# Patient Record
Sex: Female | Born: 1950 | Race: White | Hispanic: No | Marital: Married | Smoking: Never smoker
Health system: Southern US, Community
[De-identification: ages and names within clinical notes are randomized; demographics above are authoritative.]

## PROBLEM LIST (undated history)

## (undated) HISTORY — PX: OTHER SURGICAL HISTORY: SHX169

---

## 2008-10-28 ENCOUNTER — Emergency Department (HOSPITAL_COMMUNITY): Admission: EM | Admit: 2008-10-28 | Discharge: 2008-10-28 | Payer: Self-pay | Admitting: Emergency Medicine

## 2008-11-03 ENCOUNTER — Inpatient Hospital Stay (HOSPITAL_COMMUNITY): Admission: RE | Admit: 2008-11-03 | Discharge: 2008-11-05 | Payer: Self-pay | Admitting: Orthopaedic Surgery

## 2008-11-16 ENCOUNTER — Encounter (HOSPITAL_COMMUNITY): Admission: RE | Admit: 2008-11-16 | Discharge: 2008-12-16 | Payer: Self-pay | Admitting: Orthopaedic Surgery

## 2008-11-29 ENCOUNTER — Inpatient Hospital Stay (HOSPITAL_COMMUNITY): Admission: RE | Admit: 2008-11-29 | Discharge: 2008-12-02 | Payer: Self-pay | Admitting: Orthopaedic Surgery

## 2008-11-30 ENCOUNTER — Ambulatory Visit: Payer: Self-pay | Admitting: Infectious Diseases

## 2008-12-19 ENCOUNTER — Encounter (HOSPITAL_COMMUNITY): Admission: RE | Admit: 2008-12-19 | Discharge: 2009-01-18 | Payer: Self-pay | Admitting: Orthopaedic Surgery

## 2009-01-30 ENCOUNTER — Encounter (HOSPITAL_COMMUNITY): Admission: RE | Admit: 2009-01-30 | Discharge: 2009-02-14 | Payer: Self-pay | Admitting: Orthopaedic Surgery

## 2009-02-06 ENCOUNTER — Inpatient Hospital Stay (HOSPITAL_COMMUNITY): Admission: AD | Admit: 2009-02-06 | Discharge: 2009-02-09 | Payer: Self-pay | Admitting: Orthopaedic Surgery

## 2010-08-03 IMAGING — CR DG CHEST 1V PORT
1 series · 1 of 1 positions shown · non-contrast
Comparison: None

CLINICAL DATA: PICC line placement

PORTABLE CHEST - 1 VIEW

[AP]
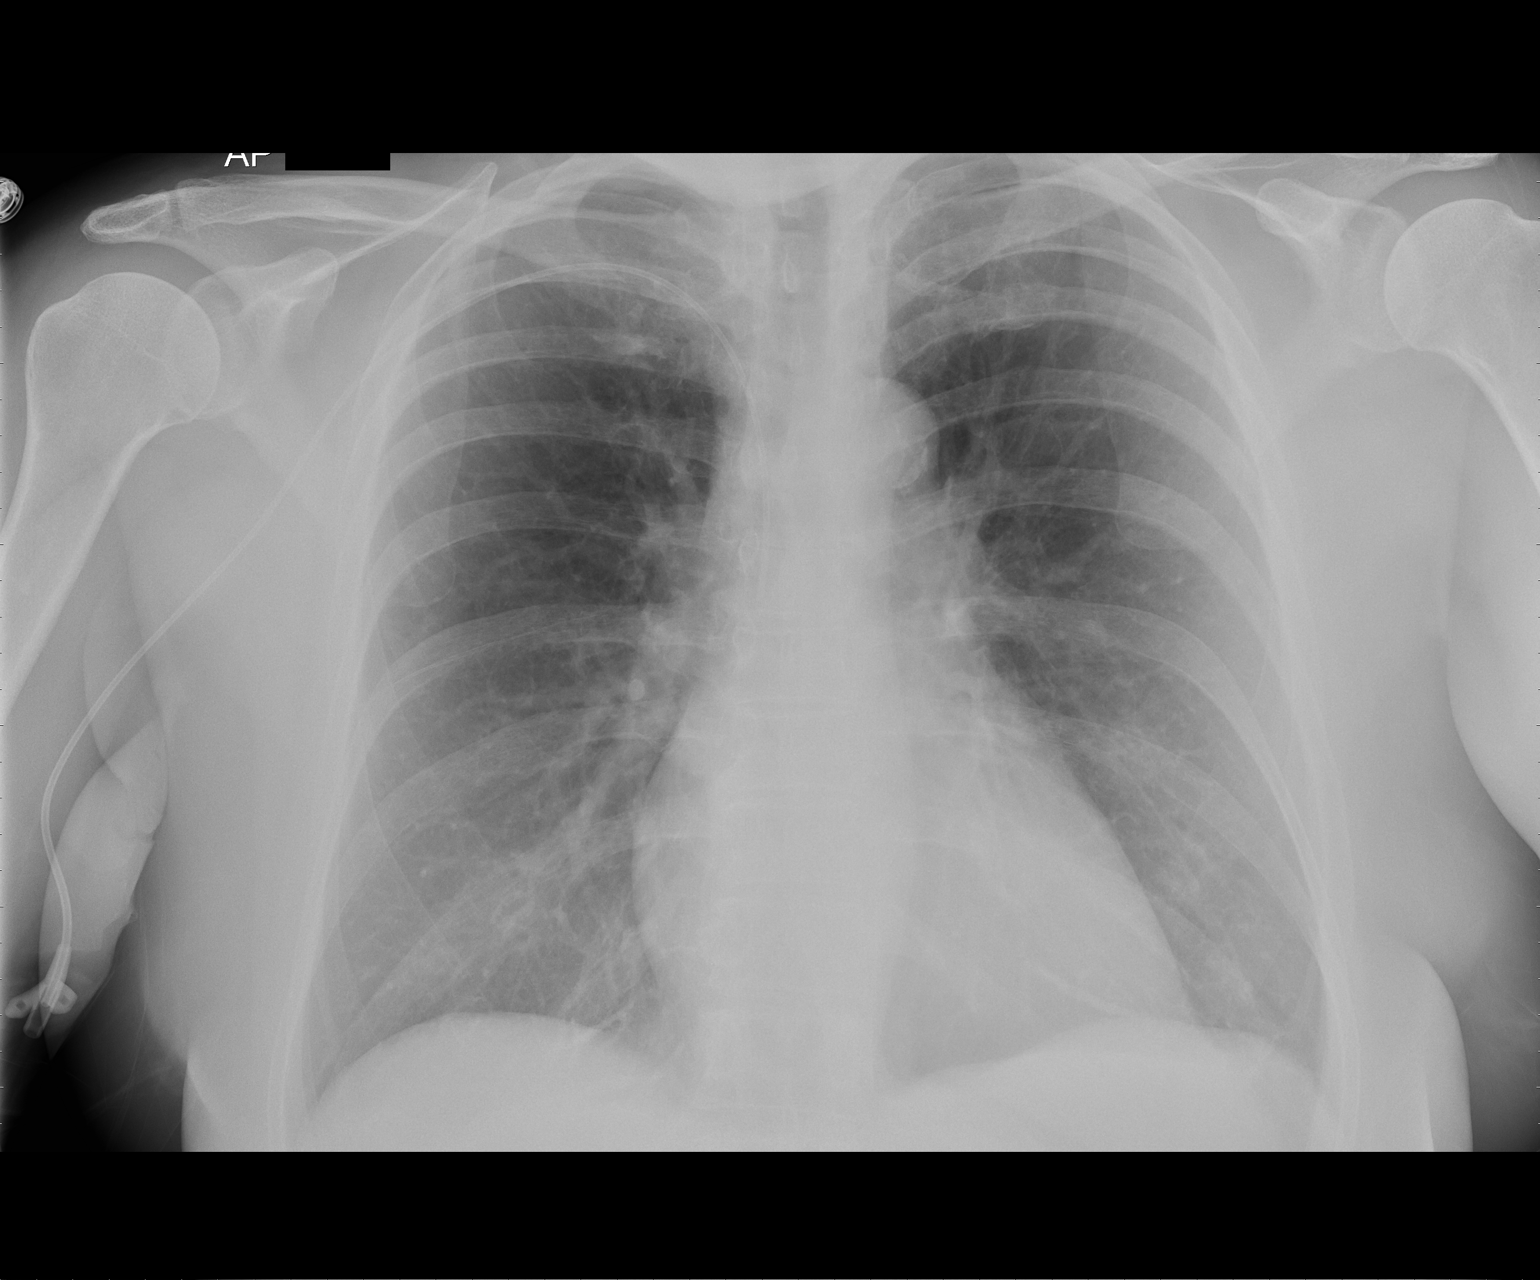

[1 of 1 positions shown; findings below may reference images not displayed]

FINDINGS: PICC line has been inserted via right upper extremity
approach.  Catheter tip is in the mid SVC.  No active
cardiopulmonary disease.
IMPRESSION: Specifically, the PICC line is in the mid SVC.

## 2010-08-23 LAB — COMPREHENSIVE METABOLIC PANEL
ALT: 10 U/L (ref 0–35)
BUN: 25 mg/dL — ABNORMAL HIGH (ref 6–23)
Calcium: 8.6 mg/dL (ref 8.4–10.5)
Creatinine, Ser: 0.82 mg/dL (ref 0.4–1.2)
Glucose, Bld: 106 mg/dL — ABNORMAL HIGH (ref 70–99)
Sodium: 138 mEq/L (ref 135–145)
Total Protein: 6.4 g/dL (ref 6.0–8.3)

## 2010-08-23 LAB — URINALYSIS, MICROSCOPIC ONLY
Nitrite: NEGATIVE
Specific Gravity, Urine: 1.021 (ref 1.005–1.030)
Urobilinogen, UA: 0.2 mg/dL (ref 0.0–1.0)
pH: 6 (ref 5.0–8.0)

## 2010-08-23 LAB — C-REACTIVE PROTEIN: CRP: 0.9 mg/dL — ABNORMAL HIGH (ref <0.6)

## 2010-08-23 LAB — CBC
Hemoglobin: 11.1 g/dL — ABNORMAL LOW (ref 12.0–15.0)
MCHC: 34.5 g/dL (ref 30.0–36.0)
RDW: 12.7 % (ref 11.5–15.5)

## 2010-08-23 LAB — CULTURE, ROUTINE-ABSCESS

## 2010-08-23 LAB — DIFFERENTIAL
Lymphocytes Relative: 39 % (ref 12–46)
Lymphs Abs: 2.5 10*3/uL (ref 0.7–4.0)
Neutro Abs: 3.2 10*3/uL (ref 1.7–7.7)
Neutrophils Relative %: 50 % (ref 43–77)

## 2010-08-23 LAB — SEDIMENTATION RATE: Sed Rate: 33 mm/hr — ABNORMAL HIGH (ref 0–22)

## 2010-08-25 LAB — COMPREHENSIVE METABOLIC PANEL
Albumin: 3.1 g/dL — ABNORMAL LOW (ref 3.5–5.2)
Alkaline Phosphatase: 97 U/L (ref 39–117)
BUN: 6 mg/dL (ref 6–23)
Calcium: 8.9 mg/dL (ref 8.4–10.5)
Potassium: 4.3 mEq/L (ref 3.5–5.1)
Sodium: 141 mEq/L (ref 135–145)
Total Protein: 5.8 g/dL — ABNORMAL LOW (ref 6.0–8.3)

## 2010-08-25 LAB — URINE MICROSCOPIC-ADD ON

## 2010-08-25 LAB — WOUND CULTURE

## 2010-08-25 LAB — ANAEROBIC CULTURE

## 2010-08-25 LAB — URINALYSIS, ROUTINE W REFLEX MICROSCOPIC
Glucose, UA: NEGATIVE mg/dL
Hgb urine dipstick: NEGATIVE
Protein, ur: NEGATIVE mg/dL
pH: 6 (ref 5.0–8.0)

## 2010-08-25 LAB — CBC
MCHC: 34.2 g/dL (ref 30.0–36.0)
Platelets: 203 10*3/uL (ref 150–400)
RDW: 12.8 % (ref 11.5–15.5)

## 2010-08-25 LAB — POCT I-STAT 4, (NA,K, GLUC, HGB,HCT): Glucose, Bld: 99 mg/dL (ref 70–99)

## 2010-08-26 LAB — DIFFERENTIAL
Basophils Absolute: 0 10*3/uL (ref 0.0–0.1)
Basophils Relative: 1 % (ref 0–1)
Eosinophils Relative: 2 % (ref 0–5)
Monocytes Absolute: 0.5 10*3/uL (ref 0.1–1.0)
Monocytes Relative: 10 % (ref 3–12)
Neutro Abs: 2.2 10*3/uL (ref 1.7–7.7)

## 2010-08-26 LAB — URINALYSIS, ROUTINE W REFLEX MICROSCOPIC
Bilirubin Urine: NEGATIVE
Hgb urine dipstick: NEGATIVE
Ketones, ur: NEGATIVE mg/dL
Specific Gravity, Urine: 1.016 (ref 1.005–1.030)
pH: 5.5 (ref 5.0–8.0)

## 2010-08-26 LAB — URINE MICROSCOPIC-ADD ON

## 2010-08-26 LAB — CBC
HCT: 39.3 % (ref 36.0–46.0)
Hemoglobin: 13.2 g/dL (ref 12.0–15.0)
MCHC: 33.7 g/dL (ref 30.0–36.0)
RBC: 4.13 MIL/uL (ref 3.87–5.11)
RDW: 13.3 % (ref 11.5–15.5)

## 2010-08-26 LAB — BASIC METABOLIC PANEL
CO2: 26 mEq/L (ref 19–32)
Calcium: 9 mg/dL (ref 8.4–10.5)
Chloride: 110 mEq/L (ref 96–112)
GFR calc Af Amer: >60 mL/min (ref >60)
Glucose, Bld: 106 mg/dL — ABNORMAL HIGH (ref 70–99)
Potassium: 4.2 mEq/L (ref 3.5–5.1)
Sodium: 140 mEq/L (ref 135–145)

## 2010-10-01 NOTE — Discharge Summary (Signed)
NAMEAyleah Pena, [name]                ACCOUNT NO.:  [id number]   MEDICAL RECORD NO.:  [id number]          PATIENT TYPE:  INP   LOCATION:  5036                         FACILITY:  [facility]   PHYSICIAN:  Mark C. Ophelia Charter, M.D.    DATE OF BIRTH:  1951/02/02   DATE OF ADMISSION:  11/29/2008  DATE OF DISCHARGE:  12/02/2008                               DISCHARGE SUMMARY   ADMISSION DIAGNOSIS:  Postop infection, status post left tibial plateau  fracture, treated with open reduction internal fixation on November 03, 2008.   DISCHARGE DIAGNOSIS:  Left lateral tibial plateau fracture, open  reduction internal fixation, postop infection secondary to subcutaneous  abscess.  Final cultures growing Serratia marcescens and treated with  Rocephin IV.   PROCEDURE:  On November 29, 2008, the patient underwent irrigation and  incisional debridement of skin, subcutaneous tissue, muscle, and fascia,  left proximal tibia; left tibial plateau incision, this was performed by  Dr. Ophelia Charter under general anesthesia.   CONSULTATIONS:  Fransisco Hertz, MD of Infectious Disease.   BRIEF HISTORY:  The patient is a 60 year old female, status post open  reduction internal fixation, left tibial plateau fracture, November 03, 2008.  Postoperatively, she did well, but about a week prior to this  admission began having pinpoint area of drainage from the midportion of  her incision.  The incision was evaluated in the office and cultured  with initial culture report showing a few gram-negative rods.  The  patient was afebrile, no cellulitis.  It was felt she would require  irrigation and debridement and was admitted for this procedure.  Cultures were taken intraoperatively and then the patient was given IV  Rocephin and IV vancomycin.  She tolerated the procedure without  difficulty.  Infectious Disease consult was obtained.  Rocephin was  added to her antibiotic regimen.  She was followed by the pharmacy for  vancomycin peaks and  troughs.  She underwent insertion of a PICC line  for prolonged antibiotic treatment.  Physical Therapy assisted her with  ambulation and gait training, touchdown weightbearing left lower  extremity with no range of motion of the left knee, and knee immobilizer  full time.  The patient's dressings were changed and her wound was  healing without drainage.  Final cultures grew Serratia marcescens and  it was felt she would require treatment with Rocephin IV 1 g daily for 4-  6 weeks.  She was able to be discharged home on December 02, 2008, at which  time, she was afebrile and vital signs were stable.  She was independent  with ambulation using a walker.   LABORATORY DATA:  Admission labs with H and H 14.3 and 42.0.  The  patient's hemoglobin dropped to 11.1 with hematocrit 32.4, WBC 6.2.  Chemistry studies were within normal limits with exception of total  protein 5.8, albumin 3.1.  Urinalysis on November 29, 2008, showed trace  leukocyte esterase, few epithelial cells, 3-6 WBCs, few bacteria, and  hyalin casts noted.   PLAN:  The patient was discharged to her home.  [facility] will  arrange home health IV treatments for the patient.  She is given  Percocet to use for her discomfort.  She was not taking medications  prior to admission and therefore, otherwise has no medications to  restart.  She will ambulate touchdown weightbearing on the operative  extremity.  She will change her dressing as needed.  She will be allowed  to shower, but covering her IV site and her wound.  She will follow up  with Dr. Ophelia Charter in 7-10 days.  All questions encouraged and answered.   CONDITION ON DISCHARGE:  Stable.      Wende Neighbors, P.A.      Mark C. Ophelia Charter, M.D.  Electronically Signed    SMV/MEDQ  D:  01/04/2009  T:  01/05/2009  Job:  [id number]

## 2010-10-01 NOTE — Op Note (Signed)
NAME:  Laurie Pena, [name]                ACCOUNT NO.:  [id number]   MEDICAL RECORD NO.:  [id number]          PATIENT TYPE:  INP   LOCATION:  5012                         FACILITY:  [facility]   PHYSICIAN:  Mark C. Ophelia Charter, M.D.    DATE OF BIRTH:  26-Apr-1951   DATE OF PROCEDURE:  11/03/2008  DATE OF DISCHARGE:                               OPERATIVE REPORT   PREOPERATIVE DIAGNOSIS:  Left displaced lateral tibial plateau fracture.   POSTOPERATIVE DIAGNOSIS:  Left displaced lateral tibial plateau  fracture.   PROCEDURE:  Open reduction and internal fixation, compression plating,  left lateral tibial plateau fracture plus Norian.   SURGEON:  Mark C. Ophelia Charter, MD   ASSISTANT:  None.   ANESTHESIA:  General plus 10 mL of Marcaine local.   DRAINS:  One Hemovac.   TOURNIQUET TIME:  59 minutes.   A 60 year old female, another dog attacked her dog that was on leash,  and she ended up falling, suffering a tibial plateau fracture in the  melee.  X-rays demonstrated angulation, split, but no involvement of the  intercondylar eminence or medial tibial plateau.   She was brought in after informed consent, standard prepping, general  anesthesia, preoperative Ancef prophylaxis, proximal thigh tourniquet,  usual impervious stockinette, Coban, DuraPrep, extremity sheets, and  drapes.  Surgical time-out checklist was completed.  A S-shape incision  was made starting just above the joint line on the lateral aspect by  Gerdy tubercle and then curving anteriorly and distally in line with the  tibia.  Leg was wrapped in Esmarch prior to tourniquet inflation and  after tourniquet was inflated, incision was made.  Fascia was split.  The muscle was periosteally stripped off the tibia, and a cortical  window was placed and use of bone impactors was used with continued  tapping until finally the split displaced fragment was elevated and then  shortest anatomic lateral Synthes plate was applied.  The plate was held  with K-wire and the distal-most screw after checking under fluoroscopy  was used to pull the plate down to the bone with a nonlocking plate  securely.  Additional screws were placed proximally with 3 proximal  lengths varying between 16-65 mm.  The kick-angled screw was placed well  and checked under fluoroscopy, AP and lateral with excellent position.  Norian was then mixed after irrigation and used to fill the defect.  Fascial split was reapproximated with interrupted sutures after Hemovac  drain was placed.  A 2-0 Vicryl in the subcutaneous tissue.  Spot fluoro  pictures were taken, AP showing the Norian in good position.  A 2-0  Vicryl in the subcutaneous tissue and subcuticular skin closure with a 4-  0 Vicryl.  Tincture of benzoin, Steri-Strips,  Marcaine infiltration, and postop dressing, and knee immobilizer was  applied.  Instrument count and needle count was correct.  Surgical time-  out checklist was completed at that time of closure, and the patient was  neurologically intact.  Compartments were soft, and the patient was  transferred to recovery room.      Mark C. Ophelia Charter, M.D.  Electronically  Signed     MCY/MEDQ  D:  11/03/2008  T:  11/04/2008  Job:  [id number]

## 2010-10-01 NOTE — Op Note (Signed)
NAME:  Laurie Pena, Laurie Pena                ACCOUNT NO.:  [id number]   MEDICAL RECORD NO.:  [id number]          PATIENT TYPE:  OIB   LOCATION:  5036                         FACILITY:  [facility]   PHYSICIAN:  Mark C. Ophelia Charter, M.D.    DATE OF BIRTH:  10-17-1950   DATE OF PROCEDURE:  11/29/2008  DATE OF DISCHARGE:                               OPERATIVE REPORT   PREOPERATIVE DIAGNOSIS:  Suspected left lateral tibial plateau fracture,  postop infection.   POSTOPERATIVE DIAGNOSIS:  Suspected left lateral tibial plateau  fracture, postop infection, likely suture of subcutaneous abscess.   PROCEDURE:  Irrigation and incisional debridement, skin, subcutaneous  tissue, muscle, and fascia, left proximal tibia, lateral tibial plateau  excision.   SURGEON:  Mark C. Ophelia Charter, MD   ANESTHESIA:  GOT.   CULTURES:  Aerobic and anaerobic cultures given before IV Rocephin and  IV vancomycin 1 g each.   This 60 year old female underwent ORIF of tibial plateau 1 month ago on  November 03, 2008.  She was doing well after surgery until a week ago she  started having a pinpoint area of drainage from midportion of her  incision.  It was milked and cultured by Dr. August Saucer after prepping and  initial lab, CRP and sed rate looked normal, white count was normal.  The patient had no fever, no chills, no cellulitis and culture results  showed a few gram-negative rods on early 24-hour culture after a gram  stain initially was negative.  She is brought in for irrigation and  debridement with potential deep infection a concern since the tibial  plateau fracture extends from the area with the tibial plateau plate  into the joint.   PROCEDURE:  After induction of general anesthesia, proximal thigh  tourniquet, prepping with DuraPrep from the ankle to the tourniquet,  impervious stockinette, Coban, and extremity sheets and drapes were  applied.  A time-out procedure was completed.  No antibiotics were  given.  Incision was opened in  the midportion and then extended after  the initial area of skin 1-2 mm pinpoint hole, midportion incision was  cored out.  There was a subcutaneous tissue that looked somewhat milky  and with this open I then opened down the plate.  Subcutaneous tissue  was cultured and cultures were made also of the deep blood around the  plate.  Plate looked good.  No evidence of loosening screws.  The layer  of the fascia was trimmed, but it actually showed no evidence of deep  infection.  No purulent material.  No fascial necrosis.  No muscle  necrosis.  Compartment was soft.  Plate was in good position.  The edge  of the fascia just immediately underneath the small areas of subcu,  midportion of the wound drainage was removed, cut back a few millimeters  just beyond the safe side and then pulse lavage was given and IV  antibiotics were given, 1 g of Vancomycin and 1 g of Rocephin.  Continued lavage still 3 liters had been irrigated.  Repeat debridements  and scraping with curette and elevator.  Inspection of  the area of the  graft and fracture and everything looked good.  Repeat irrigation and  then two #1 Vicryl undyed sutures were placed reapproximating the fascia  over  the plate.  Hemovac drain was placed and then skin staple closure with  no sutures in the subcu.  Xeroform, 4 x 4's, ABD, Ace wrap, and knee  immobilizer was applied.  The patient tolerated the procedure well and  transferred to the recovery room in stable condition.  Instrument count  and needle count was correct.      Mark C. Ophelia Charter, M.D.  Electronically Signed     MCY/MEDQ  D:  11/29/2008  T:  11/30/2008  Job:  [id number]

## 2015-10-22 DIAGNOSIS — H40011 Open angle with borderline findings, low risk, right eye: Secondary | ICD-10-CM | POA: Diagnosis not present

## 2015-10-22 DIAGNOSIS — H5213 Myopia, bilateral: Secondary | ICD-10-CM | POA: Diagnosis not present

## 2015-10-22 DIAGNOSIS — H25813 Combined forms of age-related cataract, bilateral: Secondary | ICD-10-CM | POA: Diagnosis not present

## 2015-10-22 DIAGNOSIS — H25812 Combined forms of age-related cataract, left eye: Secondary | ICD-10-CM | POA: Diagnosis not present

## 2015-11-05 ENCOUNTER — Encounter (HOSPITAL_COMMUNITY)
Admission: RE | Admit: 2015-11-05 | Discharge: 2015-11-05 | Disposition: A | Discharge status: Home / Self Care | Payer: Medicare Other | Source: Ambulatory Visit | Attending: Ophthalmology | Admitting: Ophthalmology

## 2015-11-05 ENCOUNTER — Encounter (HOSPITAL_COMMUNITY): Payer: Self-pay

## 2015-11-05 DIAGNOSIS — Z01810 Encounter for preprocedural cardiovascular examination: Secondary | ICD-10-CM | POA: Insufficient documentation

## 2015-11-05 DIAGNOSIS — Z01812 Encounter for preprocedural laboratory examination: Secondary | ICD-10-CM | POA: Diagnosis not present

## 2015-11-05 LAB — CBC
HEMATOCRIT: 40.3 % (ref 36.0–46.0)
Hemoglobin: 13.6 g/dL (ref 12.0–15.0)
MCH: 31.3 pg (ref 26.0–34.0)
MCHC: 33.7 g/dL (ref 30.0–36.0)
MCV: 92.9 fL (ref 78.0–100.0)
Platelets: 237 10*3/uL (ref 150–400)
RBC: 4.34 MIL/uL (ref 3.87–5.11)
RDW: 12.9 % (ref 11.5–15.5)
WBC: 6.8 10*3/uL (ref 4.0–10.5)

## 2015-11-05 LAB — BASIC METABOLIC PANEL
Anion gap: 4 — ABNORMAL LOW (ref 5–15)
BUN: 13 mg/dL (ref 6–20)
CALCIUM: 8.9 mg/dL (ref 8.9–10.3)
CO2: 26 mmol/L (ref 22–32)
Chloride: 107 mmol/L (ref 101–111)
Creatinine, Ser: 0.81 mg/dL (ref 0.44–1.00)
GLUCOSE: 101 mg/dL — AB (ref 65–99)
Potassium: 4.2 mmol/L (ref 3.5–5.1)
Sodium: 137 mmol/L (ref 135–145)

## 2015-11-05 NOTE — Patient Instructions (Signed)
Your procedure is scheduled on:   11/12/2015              Report to [facility] at 9:30    AM.  Call this number if you have problems the morning of surgery: [phone]   Remember:   Do not eat or drink :After Midnight.    Take these medicines the morning of surgery with A SIP OF WATER:      none      Do not wear jewelry, make-up or nail polish.  Do not wear lotions, powders, or perfumes. You may wear deodorant.  Do not bring valuables to the hospital.  Contacts, dentures or bridgework may not be worn into surgery.  Patients discharged the day of surgery will not be allowed to drive home.  Name and phone number of your driver:    @[email or web address] @ Cataract Surgery  A cataract is a clouding of the lens of the eye. When a lens becomes cloudy, vision is reduced based on the degree and nature of the clouding. Surgery may be needed to improve vision. Surgery removes the cloudy lens and usually replaces it with a substitute lens (intraocular lens, IOL). LET YOUR EYE DOCTOR KNOW ABOUT:  Allergies to food or medicine.   Medicines taken including herbs, eyedrops, over-the-counter medicines, and creams.   Use of steroids (by mouth or creams).   Previous problems with anesthetics or numbing medicine.   History of bleeding problems or blood clots.   Previous surgery.   Other health problems, including diabetes and kidney problems.   Possibility of pregnancy, if this applies.  RISKS AND COMPLICATIONS  Infection.   Inflammation of the eyeball (endophthalmitis) that can spread to both eyes (sympathetic ophthalmia).   Poor wound healing.   If an IOL is inserted, it can later fall out of proper position. This is very uncommon.   Clouding of the part of your eye that holds an IOL in place. This is called an "after-cataract." These are uncommon, but easily treated.  BEFORE THE PROCEDURE  Do not eat or drink anything except small amounts of water for 8 to 12 before your surgery, or  as directed by your caregiver.   Unless you are told otherwise, continue any eyedrops you have been prescribed.   Talk to your primary caregiver about all other medicines that you take (both prescription and non-prescription). In some cases, you may need to stop or change medicines near the time of your surgery. This is most important if you are taking blood-thinning medicine.Do not stop medicines unless you are told to do so.   Arrange for someone to drive you to and from the procedure.   Do not put contact lenses in either eye on the day of your surgery.  PROCEDURE There is more than one method for safely removing a cataract. Your doctor can explain the differences and help determine which is best for you. Phacoemulsification surgery is the most common form of cataract surgery.  An injection is given behind the eye or eyedrops are given to make this a painless procedure.   A small cut (incision) is made on the edge of the clear, dome-shaped surface that covers the front of the eye (cornea).   A tiny probe is painlessly inserted into the eye. This device gives off ultrasound waves that soften and break up the cloudy center of the lens. This makes it easier for the cloudy lens to be removed by suction.   An IOL may be implanted.  The normal lens of the eye is covered by a clear capsule. Part of that capsule is intentionally left in the eye to support the IOL.   Your surgeon may or may not use stitches to close the incision.  There are other forms of cataract surgery that require a larger incision and stiches to close the eye. This approach is taken in cases where the doctor feels that the cataract cannot be easily removed using phacoemulsification. AFTER THE PROCEDURE  When an IOL is implanted, it does not need care. It becomes a permanent part of your eye and cannot be seen or felt.   Your doctor will schedule follow-up exams to check on your progress.   Review your other medicines  with your doctor to see which can be resumed after surgery.   Use eyedrops or take medicine as prescribed by your doctor.  Document Released: 04/24/2011 Document Reviewed: 04/21/2011 [facility] Patient Information 2012 [place].  .Cataract Surgery Care After Refer to this sheet in the next few weeks. These instructions provide you with information on caring for yourself after your procedure. Your caregiver may also give you more specific instructions. Your treatment has been planned according to current medical practices, but problems sometimes occur. Call your caregiver if you have any problems or questions after your procedure.  HOME CARE INSTRUCTIONS   Avoid strenuous activities as directed by your caregiver.   Ask your caregiver when you can resume driving.   Use eyedrops or other medicines to help healing and control pressure inside your eye as directed by your caregiver.   Only take over-the-counter or prescription medicines for pain, discomfort, or fever as directed by your caregiver.   Do not to touch or rub your eyes.   You may be instructed to use a protective shield during the first few days and nights after surgery. If not, wear sunglasses to protect your eyes. This is to protect the eye from pressure or from being accidentally bumped.   Keep the area around your eye clean and dry. Avoid swimming or allowing water to hit you directly in the face while showering. Keep soap and shampoo out of your eyes.   Do not bend or lift heavy objects. Bending increases pressure in the eye. You can walk, climb stairs, and do light household chores.   Do not put a contact lens into the eye that had surgery until your caregiver says it is okay to do so.   Ask your doctor when you can return to work. This will depend on the kind of work that you do. If you work in a dusty environment, you may be advised to wear protective eyewear for a period of time.   Ask your caregiver when it will  be safe to engage in sexual activity.   Continue with your regular eye exams as directed by your caregiver.  What to expect:  It is normal to feel itching and mild discomfort for a few days after cataract surgery. Some fluid discharge is also common, and your eye may be sensitive to light and touch.   After 1 to 2 days, even moderate discomfort should disappear. In most cases, healing will take about 6 weeks.   If you received an intraocular lens (IOL), you may notice that colors are very bright or have a blue tinge. Also, if you have been in bright sunlight, everything may appear reddish for a few hours. If you see these color tinges, it is because your lens is  clear and no longer cloudy. Within a few months after receiving an IOL, these extra colors should go away. When you have healed, you will probably need new glasses.  SEEK MEDICAL CARE IF:   You have increased bruising around your eye.   You have discomfort not helped by medicine.  SEEK IMMEDIATE MEDICAL CARE IF:   You have a fever.   You have a worsening or sudden vision loss.   You have redness, swelling, or increasing pain in the eye.   You have a thick discharge from the eye that had surgery.  MAKE SURE YOU:  Understand these instructions.   Will watch your condition.   Will get help right away if you are not doing well or get worse.  Document Released: 11/22/2004 Document Revised: 04/24/2011 Document Reviewed: 12/27/2010 [facility] Patient Information 2012 [place].    Monitored Anesthesia Care  Monitored anesthesia care is an anesthesia service for a medical procedure. Anesthesia is the loss of the ability to feel pain. It is produced by medications called anesthetics. It may affect a small area of your body (local anesthesia), a large area of your body (regional anesthesia), or your entire body (general anesthesia). The need for monitored anesthesia care depends your procedure, your condition, and the potential  need for regional or general anesthesia. It is often provided during procedures where:   General anesthesia may be needed if there are complications. This is because you need special care when you are under general anesthesia.   You will be under local or regional anesthesia. This is so that you are able to have higher levels of anesthesia if needed.   You will receive calming medications (sedatives). This is especially the case if sedatives are given to put you in a semi-conscious state of relaxation (deep sedation). This is because the amount of sedative needed to produce this state can be hard to predict. Too much of a sedative can produce general anesthesia. Monitored anesthesia care is performed by one or more caregivers who have special training in all types of anesthesia. You will need to meet with these caregivers before your procedure. During this meeting, they will ask you about your medical history. They will also give you instructions to follow. (For example, you will need to stop eating and drinking before your procedure. You may also need to stop or change medications you are taking.) During your procedure, your caregivers will stay with you. They will:   Watch your condition. This includes watching you blood pressure, breathing, and level of pain.   Diagnose and treat problems that occur.   Give medications if they are needed. These may include calming medications (sedatives) and anesthetics.   Make sure you are comfortable.  Having monitored anesthesia care does not necessarily mean that you will be under anesthesia. It does mean that your caregivers will be able to manage anesthesia if you need it or if it occurs. It also means that you will be able to have a different type of anesthesia than you are having if you need it. When your procedure is complete, your caregivers will continue to watch your condition. They will make sure any medications wear off before you are allowed  to go home.  Document Released: 01/29/2005 Document Revised: 08/30/2012 Document Reviewed: 06/16/2012 [facility] Patient Information 2014 [place], [place].

## 2015-11-08 NOTE — Anesthesia Preprocedure Evaluation (Addendum)
Anesthesia Evaluation  Patient identified by MRN, date of birth, ID band Patient awake    Reviewed: Allergy & Precautions, NPO status , Patient's Chart, lab work & pertinent test results  Airway Mallampati: I  TM Distance: >3 FB Neck ROM: Full    Dental  (+) Teeth Intact, Dental Advisory Given   Pulmonary neg pulmonary ROS,    breath sounds clear to auscultation       Cardiovascular negative cardio ROS   Rhythm:Regular Rate:Normal     Neuro/Psych negative neurological ROS  negative psych ROS   GI/Hepatic negative GI ROS, Neg liver ROS,   Endo/Other  negative endocrine ROS  Renal/GU negative Renal ROS  negative genitourinary   Musculoskeletal negative musculoskeletal ROS (+)   Abdominal   Peds negative pediatric ROS (+)  Hematology negative hematology ROS (+)   Anesthesia Other Findings   Reproductive/Obstetrics negative OB ROS                            Lab Results  Component Value Date   WBC 6.8 11/05/2015   HGB 13.6 11/05/2015   HCT 40.3 11/05/2015   MCV 92.9 11/05/2015   PLT 237 11/05/2015   No results found for: INR, PROTIME  10/2015 EKG: normal EKG, normal sinus rhythm.   Anesthesia Physical Anesthesia Plan  ASA: I  Anesthesia Plan: MAC   Post-op Pain Management:    Induction: Intravenous  Airway Management Planned:   Additional Equipment:   Intra-op Plan:   Post-operative Plan: Extubation in OR  Informed Consent: I have reviewed the patients History and Physical, chart, labs and discussed the procedure including the risks, benefits and alternatives for the proposed anesthesia with the patient or authorized representative who has indicated his/her understanding and acceptance.     Plan Discussed with: CRNA  Anesthesia Plan Comments:         Anesthesia Quick Evaluation

## 2015-11-12 ENCOUNTER — Ambulatory Visit (HOSPITAL_COMMUNITY): Payer: Medicare Other | Admitting: Anesthesiology

## 2015-11-12 ENCOUNTER — Ambulatory Visit (HOSPITAL_COMMUNITY)
Admission: RE | Admit: 2015-11-12 | Discharge: 2015-11-12 | Disposition: A | Discharge status: Home / Self Care | Payer: Medicare Other | Source: Ambulatory Visit | Attending: Ophthalmology | Admitting: Ophthalmology

## 2015-11-12 ENCOUNTER — Encounter (HOSPITAL_COMMUNITY): Payer: Self-pay | Admitting: Anesthesiology

## 2015-11-12 ENCOUNTER — Encounter (HOSPITAL_COMMUNITY)
Admission: RE | Disposition: A | Discharge status: Home / Self Care | Payer: Self-pay | Source: Ambulatory Visit | Attending: Ophthalmology

## 2015-11-12 DIAGNOSIS — Z791 Long term (current) use of non-steroidal anti-inflammatories (NSAID): Secondary | ICD-10-CM | POA: Diagnosis not present

## 2015-11-12 DIAGNOSIS — H25812 Combined forms of age-related cataract, left eye: Secondary | ICD-10-CM | POA: Insufficient documentation

## 2015-11-12 DIAGNOSIS — Z87891 Personal history of nicotine dependence: Secondary | ICD-10-CM | POA: Insufficient documentation

## 2015-11-12 DIAGNOSIS — H2512 Age-related nuclear cataract, left eye: Secondary | ICD-10-CM | POA: Diagnosis not present

## 2015-11-12 HISTORY — PX: CATARACT EXTRACTION W/PHACO: SHX586

## 2015-11-12 SURGERY — PHACOEMULSIFICATION, CATARACT, WITH IOL INSERTION
Anesthesia: Monitor Anesthesia Care | Site: Eye | Laterality: Left

## 2015-11-12 MED ORDER — TETRACAINE HCL 0.5 % OP SOLN
1.0000 [drp] | OPHTHALMIC | Status: AC
  Administered 2015-11-12 (×3): 1 [drp] via OPHTHALMIC

## 2015-11-12 MED ORDER — LACTATED RINGERS IV SOLN
INTRAVENOUS | Status: DC | PRN
  Administered 2015-11-12: 09:00:00 via INTRAVENOUS

## 2015-11-12 MED ORDER — PROVISC 10 MG/ML IO SOLN
INTRAOCULAR | Status: DC | PRN
  Administered 2015-11-12: 0.85 mL via INTRAOCULAR

## 2015-11-12 MED ORDER — CYCLOPENTOLATE-PHENYLEPHRINE 0.2-1 % OP SOLN
1.0000 [drp] | OPHTHALMIC | Status: AC
  Administered 2015-11-12 (×3): 1 [drp] via OPHTHALMIC

## 2015-11-12 MED ORDER — FENTANYL CITRATE (PF) 100 MCG/2ML IJ SOLN
25.0000 ug | INTRAMUSCULAR | Status: DC | PRN
  Administered 2015-11-12: 25 ug via INTRAVENOUS
  Filled 2015-11-12: qty 2

## 2015-11-12 MED ORDER — LIDOCAINE HCL 3.5 % OP GEL
1.0000 "application " | Freq: Once | OPHTHALMIC | Status: AC
  Administered 2015-11-12: 1 via OPHTHALMIC

## 2015-11-12 MED ORDER — MIDAZOLAM HCL 2 MG/2ML IJ SOLN
1.0000 mg | INTRAMUSCULAR | Status: DC | PRN
  Administered 2015-11-12 (×2): 1 mg via INTRAVENOUS
  Filled 2015-11-12: qty 2

## 2015-11-12 MED ORDER — PHENYLEPHRINE HCL 2.5 % OP SOLN
1.0000 [drp] | OPHTHALMIC | Status: AC
  Administered 2015-11-12 (×3): 1 [drp] via OPHTHALMIC

## 2015-11-12 MED ORDER — NEOMYCIN-POLYMYXIN-DEXAMETH 3.5-10000-0.1 OP SUSP
OPHTHALMIC | Status: DC | PRN
  Administered 2015-11-12: 2 [drp] via OPHTHALMIC

## 2015-11-12 MED ORDER — POVIDONE-IODINE 5 % OP SOLN
OPHTHALMIC | Status: DC | PRN
  Administered 2015-11-12: 1 via OPHTHALMIC

## 2015-11-12 MED ORDER — BSS IO SOLN
INTRAOCULAR | Status: DC | PRN
  Administered 2015-11-12: 15 mL

## 2015-11-12 MED ORDER — EPINEPHRINE HCL 1 MG/ML IJ SOLN
INTRAMUSCULAR | Status: DC | PRN
  Administered 2015-11-12: 500 mL

## 2015-11-12 MED ORDER — ONDANSETRON HCL 4 MG/2ML IJ SOLN
4.0000 mg | Freq: Once | INTRAMUSCULAR | Status: AC
  Administered 2015-11-12: 4 mg via INTRAVENOUS
  Filled 2015-11-12: qty 2

## 2015-11-12 MED ORDER — LIDOCAINE HCL (PF) 1 % IJ SOLN
INTRAMUSCULAR | Status: DC | PRN
  Administered 2015-11-12: .4 mL

## 2015-11-12 MED ORDER — LIDOCAINE 3.5 % OP GEL OPTIME - NO CHARGE
OPHTHALMIC | Status: DC | PRN
  Administered 2015-11-12: 1 [drp] via OPHTHALMIC

## 2015-11-12 SURGICAL SUPPLY — 11 items
CLOTH BEACON ORANGE TIMEOUT ST (SAFETY) ×2 IMPLANT
EYE SHIELD UNIVERSAL CLEAR (GAUZE/BANDAGES/DRESSINGS) ×2 IMPLANT
GLOVE BIOGEL PI IND STRL 7.0 (GLOVE) ×1 IMPLANT
GLOVE BIOGEL PI INDICATOR 7.0 (GLOVE) ×1
GLOVE EXAM NITRILE MD LF STRL (GLOVE) ×2 IMPLANT
PAD ARMBOARD 7.5X6 YLW CONV (MISCELLANEOUS) ×2 IMPLANT
SIGHTPATH CAT PROC W REG LENS (Ophthalmic Related) ×2 IMPLANT
SYRINGE LUER LOK 1CC (MISCELLANEOUS) ×2 IMPLANT
TAPE SURG TRANSPORE 1 IN (GAUZE/BANDAGES/DRESSINGS) ×1 IMPLANT
TAPE SURGICAL TRANSPORE 1 IN (GAUZE/BANDAGES/DRESSINGS) ×1
WATER STERILE IRR 250ML POUR (IV SOLUTION) ×2 IMPLANT

## 2015-11-12 NOTE — Op Note (Signed)
Date of Admission: 11/12/2015  Date of Surgery: 11/12/2015   Pre-Op Dx: Cataract Left Eye  Post-Op Dx: Senile Combined Cataract Left  Eye,  Dx Code Z61.096H25.812  Surgeon: Gemma PayorKerry Dorene Bruni, M.D.  Assistants: None  Anesthesia: Topical with MAC  Indications: Painless, progressive loss of vision with compromise of daily activities.  Surgery: Cataract Extraction with Intraocular lens Implant Left Eye  Discription: The patient had dilating drops and viscous lidocaine placed into the Left eye in the pre-op holding area. After transfer to the operating room, a time out was performed. The patient was then prepped and draped. Beginning with a 75 degree blade a paracentesis port was made at the surgeon's 2 o'clock position. The anterior chamber was then filled with 1% non-preserved lidocaine. This was followed by filling the anterior chamber with Provisc.  A 2.34mm keratome blade was used to make a clear corneal incision at the temporal limbus.  A bent cystatome needle was used to create a continuous tear capsulotomy. Hydrodissection was performed with balanced salt solution on a Fine canula. The lens nucleus was then removed using the phacoemulsification handpiece. Residual cortex was removed with the I&A handpiece. The anterior chamber and capsular bag were refilled with Provisc. A posterior chamber intraocular lens was placed into the capsular bag with it's injector. The implant was positioned with the Kuglan hook. The Provisc was then removed from the anterior chamber and capsular bag with the I&A handpiece. Stromal hydration of the main incision and paracentesis port was performed with BSS on a Fine canula. The wounds were tested for leak which was negative. The patient tolerated the procedure well. There were no operative complications. The patient was then transferred to the recovery room in stable condition.  Complications: None  Specimen: None  EBL: None  Prosthetic device: Hoya iSert 250, power 19.0 D, SN  [id number].

## 2015-11-12 NOTE — Transfer of Care (Signed)
Immediate Anesthesia Transfer of Care Note  Patient: Laurie Pena  Procedure(s) Performed: Procedure(s) with comments: CATARACT EXTRACTION PHACO AND INTRAOCULAR LENS PLACEMENT (IOC) (Left) - CDE: 6.63  Patient Location: Short Stay  Anesthesia Type:MAC  Level of Consciousness: awake, alert , oriented and patient cooperative  Airway & Oxygen Therapy: Patient Spontanous Breathing  Post-op Assessment: Report given to RN, Post -op Vital signs reviewed and stable and Patient moving all extremities  Post vital signs: Reviewed and stable  Last Vitals:  Filed Vitals:   11/12/15 0935 11/12/15 0940  BP: 127/74 133/76  Pulse:    Temp:    Resp: 23 18    Last Pain: There were no vitals filed for this visit.       Complications: No apparent anesthesia complications

## 2015-11-12 NOTE — H&P (Signed)
I have reviewed the H&P, the patient was re-examined, and I have identified no interval changes in medical condition and plan of care since the history and physical of record  

## 2015-11-12 NOTE — Addendum Note (Signed)
Addendum  created 11/12/15 1004 by Shelton SilvasKevin D Hollis, MD   Modules edited: Anesthesia Attestations

## 2015-11-12 NOTE — Anesthesia Postprocedure Evaluation (Signed)
Anesthesia Post Note  Patient: Fritzi MandesMargaret D Rocks  Procedure(s) Performed: Procedure(s) (LRB): CATARACT EXTRACTION PHACO AND INTRAOCULAR LENS PLACEMENT (IOC) (Left)  Patient location during evaluation: Short Stay Anesthesia Type: MAC Level of consciousness: awake and alert, oriented and patient cooperative Pain management: pain level controlled Vital Signs Assessment: post-procedure vital signs reviewed and stable Respiratory status: spontaneous breathing, nonlabored ventilation and respiratory function stable Cardiovascular status: blood pressure returned to baseline Postop Assessment: no signs of nausea or vomiting Anesthetic complications: no    Last Vitals:  Filed Vitals:   11/12/15 0935 11/12/15 0940  BP: 127/74 133/76  Pulse:    Temp:    Resp: 23 18    Last Pain: There were no vitals filed for this visit.               Sharmeka Palmisano J

## 2015-11-13 ENCOUNTER — Encounter (HOSPITAL_COMMUNITY): Payer: Self-pay | Admitting: Ophthalmology

## 2015-12-03 DIAGNOSIS — H25811 Combined forms of age-related cataract, right eye: Secondary | ICD-10-CM | POA: Diagnosis not present

## 2015-12-03 DIAGNOSIS — Z961 Presence of intraocular lens: Secondary | ICD-10-CM | POA: Diagnosis not present

## 2015-12-03 DIAGNOSIS — H5213 Myopia, bilateral: Secondary | ICD-10-CM | POA: Diagnosis not present

## 2015-12-10 ENCOUNTER — Encounter (HOSPITAL_COMMUNITY): Payer: Self-pay

## 2015-12-11 ENCOUNTER — Encounter (HOSPITAL_COMMUNITY)
Admission: RE | Admit: 2015-12-11 | Discharge: 2015-12-11 | Disposition: A | Discharge status: Home / Self Care | Payer: Medicare Other | Source: Ambulatory Visit | Attending: Ophthalmology | Admitting: Ophthalmology

## 2015-12-13 ENCOUNTER — Ambulatory Visit (HOSPITAL_COMMUNITY)
Admission: RE | Admit: 2015-12-13 | Discharge: 2015-12-13 | Disposition: A | Discharge status: Home / Self Care | Payer: Medicare Other | Source: Ambulatory Visit | Attending: Ophthalmology | Admitting: Ophthalmology

## 2015-12-13 ENCOUNTER — Ambulatory Visit (HOSPITAL_COMMUNITY): Payer: Medicare Other | Admitting: Anesthesiology

## 2015-12-13 ENCOUNTER — Encounter (HOSPITAL_COMMUNITY): Payer: Self-pay | Admitting: *Deleted

## 2015-12-13 ENCOUNTER — Encounter (HOSPITAL_COMMUNITY)
Admission: RE | Disposition: A | Discharge status: Home / Self Care | Payer: Self-pay | Source: Ambulatory Visit | Attending: Ophthalmology

## 2015-12-13 DIAGNOSIS — H25811 Combined forms of age-related cataract, right eye: Secondary | ICD-10-CM | POA: Insufficient documentation

## 2015-12-13 DIAGNOSIS — Z79899 Other long term (current) drug therapy: Secondary | ICD-10-CM | POA: Insufficient documentation

## 2015-12-13 DIAGNOSIS — Z791 Long term (current) use of non-steroidal anti-inflammatories (NSAID): Secondary | ICD-10-CM | POA: Insufficient documentation

## 2015-12-13 DIAGNOSIS — H269 Unspecified cataract: Secondary | ICD-10-CM | POA: Diagnosis not present

## 2015-12-13 HISTORY — PX: CATARACT EXTRACTION W/PHACO: SHX586

## 2015-12-13 SURGERY — PHACOEMULSIFICATION, CATARACT, WITH IOL INSERTION
Anesthesia: Monitor Anesthesia Care | Site: Eye | Laterality: Right

## 2015-12-13 MED ORDER — EPINEPHRINE HCL 1 MG/ML IJ SOLN
INTRAMUSCULAR | Status: DC | PRN
  Administered 2015-12-13: 500 mL

## 2015-12-13 MED ORDER — LIDOCAINE HCL (PF) 1 % IJ SOLN
INTRAMUSCULAR | Status: DC | PRN
  Administered 2015-12-13: .6 mL

## 2015-12-13 MED ORDER — MIDAZOLAM HCL 2 MG/2ML IJ SOLN
INTRAMUSCULAR | Status: AC
  Filled 2015-12-13: qty 2

## 2015-12-13 MED ORDER — MIDAZOLAM HCL 2 MG/2ML IJ SOLN
1.0000 mg | INTRAMUSCULAR | Status: DC | PRN
  Administered 2015-12-13: 2 mg via INTRAVENOUS

## 2015-12-13 MED ORDER — CYCLOPENTOLATE-PHENYLEPHRINE 0.2-1 % OP SOLN
1.0000 [drp] | OPHTHALMIC | Status: AC
  Administered 2015-12-13 (×3): 1 [drp] via OPHTHALMIC

## 2015-12-13 MED ORDER — POVIDONE-IODINE 5 % OP SOLN
OPHTHALMIC | Status: DC | PRN
  Administered 2015-12-13: 1 via OPHTHALMIC

## 2015-12-13 MED ORDER — PROVISC 10 MG/ML IO SOLN
INTRAOCULAR | Status: DC | PRN
  Administered 2015-12-13: 0.85 mL via INTRAOCULAR

## 2015-12-13 MED ORDER — TETRACAINE HCL 0.5 % OP SOLN
1.0000 [drp] | OPHTHALMIC | Status: AC
  Administered 2015-12-13 (×3): 1 [drp] via OPHTHALMIC

## 2015-12-13 MED ORDER — PHENYLEPHRINE HCL 2.5 % OP SOLN
1.0000 [drp] | OPHTHALMIC | Status: AC
  Administered 2015-12-13 (×3): 1 [drp] via OPHTHALMIC

## 2015-12-13 MED ORDER — EPINEPHRINE HCL 1 MG/ML IJ SOLN
INTRAMUSCULAR | Status: AC
  Filled 2015-12-13: qty 1

## 2015-12-13 MED ORDER — FENTANYL CITRATE (PF) 100 MCG/2ML IJ SOLN
INTRAMUSCULAR | Status: AC
  Filled 2015-12-13: qty 2

## 2015-12-13 MED ORDER — NEOMYCIN-POLYMYXIN-DEXAMETH 3.5-10000-0.1 OP SUSP
OPHTHALMIC | Status: DC | PRN
  Administered 2015-12-13: 2 [drp] via OPHTHALMIC

## 2015-12-13 MED ORDER — BSS IO SOLN
INTRAOCULAR | Status: DC | PRN
  Administered 2015-12-13: 15 mL

## 2015-12-13 MED ORDER — LIDOCAINE HCL 3.5 % OP GEL
1.0000 "application " | Freq: Once | OPHTHALMIC | Status: AC
  Administered 2015-12-13: 1 via OPHTHALMIC

## 2015-12-13 MED ORDER — LACTATED RINGERS IV SOLN
INTRAVENOUS | Status: DC
  Administered 2015-12-13: 08:00:00 via INTRAVENOUS

## 2015-12-13 MED ORDER — FENTANYL CITRATE (PF) 100 MCG/2ML IJ SOLN
25.0000 ug | INTRAMUSCULAR | Status: AC | PRN
  Administered 2015-12-13 (×2): 25 ug via INTRAVENOUS

## 2015-12-13 SURGICAL SUPPLY — 11 items
CLOTH BEACON ORANGE TIMEOUT ST (SAFETY) ×2 IMPLANT
EYE SHIELD UNIVERSAL CLEAR (GAUZE/BANDAGES/DRESSINGS) ×2 IMPLANT
GLOVE BIOGEL PI IND STRL 6.5 (GLOVE) ×1 IMPLANT
GLOVE BIOGEL PI INDICATOR 6.5 (GLOVE) ×1
GLOVE EXAM NITRILE MD LF STRL (GLOVE) ×2 IMPLANT
PAD ARMBOARD 7.5X6 YLW CONV (MISCELLANEOUS) ×2 IMPLANT
SIGHTPATH CAT PROC W REG LENS (Ophthalmic Related) ×2 IMPLANT
SYRINGE LUER LOK 1CC (MISCELLANEOUS) ×2 IMPLANT
TAPE SURG TRANSPORE 1 IN (GAUZE/BANDAGES/DRESSINGS) ×1 IMPLANT
TAPE SURGICAL TRANSPORE 1 IN (GAUZE/BANDAGES/DRESSINGS) ×1
WATER STERILE IRR 250ML POUR (IV SOLUTION) ×2 IMPLANT

## 2015-12-13 NOTE — Op Note (Signed)
Date of Admission: 12/13/2015  Date of Surgery: 12/13/2015   Pre-Op Dx: Cataract Right Eye  Post-Op Dx: Senile Combined Cataract Right  Eye,  Dx Code H88.502  Surgeon: Gemma Payor, M.D.  Assistants: None  Anesthesia: Topical with MAC  Indications: Painless, progressive loss of vision with compromise of daily activities.  Surgery: Cataract Extraction with Intraocular lens Implant Right Eye  Discription: The patient had dilating drops and viscous lidocaine placed into the Right eye in the pre-op holding area. After transfer to the operating room, a time out was performed. The patient was then prepped and draped. Beginning with a 75 degree blade a paracentesis port was made at the surgeon's 2 o'clock position. The anterior chamber was then filled with 1% non-preserved lidocaine. This was followed by filling the anterior chamber with Provisc.  A 2.81mm keratome blade was used to make a clear corneal incision at the temporal limbus.  A bent cystatome needle was used to create a continuous tear capsulotomy. Hydrodissection was performed with balanced salt solution on a Fine canula. The lens nucleus was then removed using the phacoemulsification handpiece. Residual cortex was removed with the I&A handpiece. The anterior chamber and capsular bag were refilled with Provisc. A posterior chamber intraocular lens was placed into the capsular bag with it's injector. The implant was positioned with the Kuglan hook. The Provisc was then removed from the anterior chamber and capsular bag with the I&A handpiece. Stromal hydration of the main incision and paracentesis port was performed with BSS on a Fine canula. The wounds were tested for leak which was negative. The patient tolerated the procedure well. There were no operative complications. The patient was then transferred to the recovery room in stable condition.  Complications: None  Specimen: None  EBL: None  Prosthetic device: Hoya iSert 250, power 16.0  D, SN [id number].

## 2015-12-13 NOTE — Transfer of Care (Signed)
Immediate Anesthesia Transfer of Care Note  Patient: Laurie Pena  Procedure(s) Performed: Procedure(s) with comments: CATARACT EXTRACTION PHACO AND INTRAOCULAR LENS PLACEMENT RIGHT EYE (Right) - CDE: 4.48  Patient Location: Short Stay  Anesthesia Type:MAC  Level of Consciousness: awake  Airway & Oxygen Therapy: Patient Spontanous Breathing  Post-op Assessment: Report given to RN  Post vital signs: Reviewed  Last Vitals:  Vitals:   12/13/15 0750 12/13/15 0755  BP:  128/72  Resp: 18 18  Temp:      Last Pain: There were no vitals filed for this visit.    Patients Stated Pain Goal: 9 (12/13/15 0710)  Complications: No apparent anesthesia complications

## 2015-12-13 NOTE — Anesthesia Preprocedure Evaluation (Signed)
Anesthesia Evaluation  Patient identified by MRN, date of birth, ID band Patient awake    Reviewed: Allergy & Precautions, NPO status , Patient's Chart, lab work & pertinent test results  Airway Mallampati: I  TM Distance: >3 FB Neck ROM: Full    Dental  (+) Teeth Intact, Dental Advisory Given   Pulmonary neg pulmonary ROS,    breath sounds clear to auscultation       Cardiovascular negative cardio ROS   Rhythm:Regular Rate:Normal     Neuro/Psych negative neurological ROS  negative psych ROS   GI/Hepatic negative GI ROS, Neg liver ROS,   Endo/Other  negative endocrine ROS  Renal/GU negative Renal ROS  negative genitourinary   Musculoskeletal negative musculoskeletal ROS (+)   Abdominal   Peds negative pediatric ROS (+)  Hematology negative hematology ROS (+)   Anesthesia Other Findings   Reproductive/Obstetrics negative OB ROS                             Anesthesia Physical Anesthesia Plan  ASA: I  Anesthesia Plan: MAC   Post-op Pain Management:    Induction: Intravenous  Airway Management Planned:   Additional Equipment:   Intra-op Plan:   Post-operative Plan: Extubation in OR  Informed Consent: I have reviewed the patients History and Physical, chart, labs and discussed the procedure including the risks, benefits and alternatives for the proposed anesthesia with the patient or authorized representative who has indicated his/her understanding and acceptance.     Plan Discussed with: CRNA  Anesthesia Plan Comments:         Anesthesia Quick Evaluation

## 2015-12-13 NOTE — Discharge Instructions (Signed)

## 2015-12-13 NOTE — Anesthesia Postprocedure Evaluation (Signed)
Anesthesia Post Note  Patient: Laurie Pena  Procedure(s) Performed: Procedure(s) (LRB): CATARACT EXTRACTION PHACO AND INTRAOCULAR LENS PLACEMENT RIGHT EYE (Right)  Patient location during evaluation: Short Stay Anesthesia Type: MAC Level of consciousness: awake and alert and oriented Pain management: pain level controlled Vital Signs Assessment: post-procedure vital signs reviewed and stable Respiratory status: spontaneous breathing Cardiovascular status: stable Postop Assessment: no signs of nausea or vomiting Anesthetic complications: no    Last Vitals:  Vitals:   12/13/15 0750 12/13/15 0755  BP:  128/72  Resp: 18 18  Temp:      Last Pain: There were no vitals filed for this visit.               [place] Droll

## 2015-12-13 NOTE — H&P (Signed)
I have reviewed the H&P, the patient was re-examined, and I have identified no interval changes in medical condition and plan of care since the history and physical of record  

## 2015-12-19 ENCOUNTER — Encounter (HOSPITAL_COMMUNITY): Payer: Self-pay | Admitting: Ophthalmology

## 2016-09-01 ENCOUNTER — Other Ambulatory Visit: Payer: Self-pay | Admitting: Internal Medicine

## 2016-09-01 DIAGNOSIS — Z1231 Encounter for screening mammogram for malignant neoplasm of breast: Secondary | ICD-10-CM

## 2016-09-30 ENCOUNTER — Ambulatory Visit: Payer: Self-pay

## 2016-10-03 ENCOUNTER — Ambulatory Visit: Payer: Self-pay

## 2016-10-24 ENCOUNTER — Ambulatory Visit: Payer: Self-pay

## 2018-03-15 DIAGNOSIS — Z1389 Encounter for screening for other disorder: Secondary | ICD-10-CM | POA: Diagnosis not present

## 2018-03-15 DIAGNOSIS — Z0000 Encounter for general adult medical examination without abnormal findings: Secondary | ICD-10-CM | POA: Diagnosis not present

## 2018-03-15 DIAGNOSIS — Z23 Encounter for immunization: Secondary | ICD-10-CM | POA: Diagnosis not present

## 2018-03-19 DIAGNOSIS — R739 Hyperglycemia, unspecified: Secondary | ICD-10-CM | POA: Diagnosis not present

## 2018-03-19 DIAGNOSIS — Z0000 Encounter for general adult medical examination without abnormal findings: Secondary | ICD-10-CM | POA: Diagnosis not present

## 2018-03-19 DIAGNOSIS — Z1389 Encounter for screening for other disorder: Secondary | ICD-10-CM | POA: Diagnosis not present

## 2018-03-19 DIAGNOSIS — Z23 Encounter for immunization: Secondary | ICD-10-CM | POA: Diagnosis not present

## 2018-03-19 DIAGNOSIS — R7309 Other abnormal glucose: Secondary | ICD-10-CM | POA: Diagnosis not present

## 2018-08-10 DIAGNOSIS — L259 Unspecified contact dermatitis, unspecified cause: Secondary | ICD-10-CM | POA: Diagnosis not present

## 2018-08-10 DIAGNOSIS — L255 Unspecified contact dermatitis due to plants, except food: Secondary | ICD-10-CM | POA: Diagnosis not present

## 2018-08-10 DIAGNOSIS — Z1389 Encounter for screening for other disorder: Secondary | ICD-10-CM | POA: Diagnosis not present

## 2019-10-26 DIAGNOSIS — M25461 Effusion, right knee: Secondary | ICD-10-CM | POA: Diagnosis not present

## 2019-10-26 DIAGNOSIS — Z0000 Encounter for general adult medical examination without abnormal findings: Secondary | ICD-10-CM | POA: Diagnosis not present

## 2019-10-26 DIAGNOSIS — Z1389 Encounter for screening for other disorder: Secondary | ICD-10-CM | POA: Diagnosis not present

## 2019-10-26 DIAGNOSIS — M1711 Unilateral primary osteoarthritis, right knee: Secondary | ICD-10-CM | POA: Diagnosis not present

## 2020-11-06 DIAGNOSIS — Z0001 Encounter for general adult medical examination with abnormal findings: Secondary | ICD-10-CM | POA: Diagnosis not present

## 2020-11-06 DIAGNOSIS — T148XXD Other injury of unspecified body region, subsequent encounter: Secondary | ICD-10-CM | POA: Diagnosis not present

## 2020-11-08 ENCOUNTER — Other Ambulatory Visit: Payer: Self-pay | Admitting: Physician Assistant

## 2020-11-08 DIAGNOSIS — E2839 Other primary ovarian failure: Secondary | ICD-10-CM

## 2020-11-08 DIAGNOSIS — Z1231 Encounter for screening mammogram for malignant neoplasm of breast: Secondary | ICD-10-CM

## 2020-11-12 ENCOUNTER — Other Ambulatory Visit: Payer: Self-pay | Admitting: Physician Assistant

## 2020-11-12 DIAGNOSIS — Z1231 Encounter for screening mammogram for malignant neoplasm of breast: Secondary | ICD-10-CM

## 2020-11-27 ENCOUNTER — Inpatient Hospital Stay: Admission: RE | Admit: 2020-11-27 | Payer: Medicare Other | Source: Ambulatory Visit

## 2021-09-09 DIAGNOSIS — J329 Chronic sinusitis, unspecified: Secondary | ICD-10-CM | POA: Diagnosis not present

## 2021-09-09 DIAGNOSIS — J9801 Acute bronchospasm: Secondary | ICD-10-CM | POA: Diagnosis not present

## 2021-09-11 ENCOUNTER — Other Ambulatory Visit: Payer: Self-pay | Admitting: Family Medicine

## 2021-09-11 DIAGNOSIS — Z1231 Encounter for screening mammogram for malignant neoplasm of breast: Secondary | ICD-10-CM

## 2021-09-13 ENCOUNTER — Ambulatory Visit
Admission: RE | Admit: 2021-09-13 | Discharge: 2021-09-13 | Disposition: A | Discharge status: Home / Self Care | Payer: Medicare Other | Source: Ambulatory Visit | Attending: Family Medicine | Admitting: Family Medicine

## 2021-09-13 DIAGNOSIS — Z1231 Encounter for screening mammogram for malignant neoplasm of breast: Secondary | ICD-10-CM

## 2021-11-15 ENCOUNTER — Other Ambulatory Visit (HOSPITAL_COMMUNITY): Payer: Self-pay | Admitting: Internal Medicine

## 2021-11-15 DIAGNOSIS — E2839 Other primary ovarian failure: Secondary | ICD-10-CM

## 2021-11-15 DIAGNOSIS — Z0001 Encounter for general adult medical examination with abnormal findings: Secondary | ICD-10-CM | POA: Diagnosis not present

## 2021-11-15 DIAGNOSIS — M1711 Unilateral primary osteoarthritis, right knee: Secondary | ICD-10-CM | POA: Diagnosis not present

## 2021-12-05 DIAGNOSIS — E559 Vitamin D deficiency, unspecified: Secondary | ICD-10-CM | POA: Diagnosis not present

## 2021-12-05 DIAGNOSIS — D518 Other vitamin B12 deficiency anemias: Secondary | ICD-10-CM | POA: Diagnosis not present

## 2021-12-05 DIAGNOSIS — E782 Mixed hyperlipidemia: Secondary | ICD-10-CM | POA: Diagnosis not present

## 2021-12-05 DIAGNOSIS — E039 Hypothyroidism, unspecified: Secondary | ICD-10-CM | POA: Diagnosis not present

## 2022-04-29 DIAGNOSIS — M5030 Other cervical disc degeneration, unspecified cervical region: Secondary | ICD-10-CM | POA: Diagnosis not present

## 2022-04-29 DIAGNOSIS — M542 Cervicalgia: Secondary | ICD-10-CM | POA: Diagnosis not present

## 2022-04-29 DIAGNOSIS — Z6824 Body mass index (BMI) 24.0-24.9, adult: Secondary | ICD-10-CM | POA: Diagnosis not present

## 2022-11-19 DIAGNOSIS — E782 Mixed hyperlipidemia: Secondary | ICD-10-CM | POA: Diagnosis not present

## 2022-11-19 DIAGNOSIS — E039 Hypothyroidism, unspecified: Secondary | ICD-10-CM | POA: Diagnosis not present

## 2022-11-19 DIAGNOSIS — M5030 Other cervical disc degeneration, unspecified cervical region: Secondary | ICD-10-CM | POA: Diagnosis not present

## 2022-11-19 DIAGNOSIS — E559 Vitamin D deficiency, unspecified: Secondary | ICD-10-CM | POA: Diagnosis not present

## 2022-11-19 DIAGNOSIS — Z6823 Body mass index (BMI) 23.0-23.9, adult: Secondary | ICD-10-CM | POA: Diagnosis not present

## 2022-11-19 DIAGNOSIS — D518 Other vitamin B12 deficiency anemias: Secondary | ICD-10-CM | POA: Diagnosis not present

## 2022-11-19 DIAGNOSIS — Z0001 Encounter for general adult medical examination with abnormal findings: Secondary | ICD-10-CM | POA: Diagnosis not present

## 2022-12-17 DIAGNOSIS — S338XXA Sprain of other parts of lumbar spine and pelvis, initial encounter: Secondary | ICD-10-CM | POA: Diagnosis not present

## 2022-12-17 DIAGNOSIS — S134XXA Sprain of ligaments of cervical spine, initial encounter: Secondary | ICD-10-CM | POA: Diagnosis not present

## 2022-12-17 DIAGNOSIS — M9902 Segmental and somatic dysfunction of thoracic region: Secondary | ICD-10-CM | POA: Diagnosis not present

## 2022-12-17 DIAGNOSIS — M9903 Segmental and somatic dysfunction of lumbar region: Secondary | ICD-10-CM | POA: Diagnosis not present

## 2022-12-17 DIAGNOSIS — M9901 Segmental and somatic dysfunction of cervical region: Secondary | ICD-10-CM | POA: Diagnosis not present

## 2022-12-19 DIAGNOSIS — M9901 Segmental and somatic dysfunction of cervical region: Secondary | ICD-10-CM | POA: Diagnosis not present

## 2022-12-19 DIAGNOSIS — S134XXA Sprain of ligaments of cervical spine, initial encounter: Secondary | ICD-10-CM | POA: Diagnosis not present

## 2022-12-19 DIAGNOSIS — M9903 Segmental and somatic dysfunction of lumbar region: Secondary | ICD-10-CM | POA: Diagnosis not present

## 2022-12-19 DIAGNOSIS — S338XXA Sprain of other parts of lumbar spine and pelvis, initial encounter: Secondary | ICD-10-CM | POA: Diagnosis not present

## 2022-12-19 DIAGNOSIS — M9902 Segmental and somatic dysfunction of thoracic region: Secondary | ICD-10-CM | POA: Diagnosis not present

## 2022-12-26 DIAGNOSIS — M9901 Segmental and somatic dysfunction of cervical region: Secondary | ICD-10-CM | POA: Diagnosis not present

## 2022-12-26 DIAGNOSIS — S338XXA Sprain of other parts of lumbar spine and pelvis, initial encounter: Secondary | ICD-10-CM | POA: Diagnosis not present

## 2022-12-26 DIAGNOSIS — M9903 Segmental and somatic dysfunction of lumbar region: Secondary | ICD-10-CM | POA: Diagnosis not present

## 2022-12-26 DIAGNOSIS — M9902 Segmental and somatic dysfunction of thoracic region: Secondary | ICD-10-CM | POA: Diagnosis not present

## 2022-12-26 DIAGNOSIS — S134XXA Sprain of ligaments of cervical spine, initial encounter: Secondary | ICD-10-CM | POA: Diagnosis not present

## 2023-01-01 DIAGNOSIS — D225 Melanocytic nevi of trunk: Secondary | ICD-10-CM | POA: Diagnosis not present

## 2023-01-01 DIAGNOSIS — Z1283 Encounter for screening for malignant neoplasm of skin: Secondary | ICD-10-CM | POA: Diagnosis not present

## 2023-01-12 DIAGNOSIS — S134XXA Sprain of ligaments of cervical spine, initial encounter: Secondary | ICD-10-CM | POA: Diagnosis not present

## 2023-01-12 DIAGNOSIS — M9901 Segmental and somatic dysfunction of cervical region: Secondary | ICD-10-CM | POA: Diagnosis not present

## 2023-01-12 DIAGNOSIS — S338XXA Sprain of other parts of lumbar spine and pelvis, initial encounter: Secondary | ICD-10-CM | POA: Diagnosis not present

## 2023-01-12 DIAGNOSIS — M9903 Segmental and somatic dysfunction of lumbar region: Secondary | ICD-10-CM | POA: Diagnosis not present

## 2023-01-12 DIAGNOSIS — M9902 Segmental and somatic dysfunction of thoracic region: Secondary | ICD-10-CM | POA: Diagnosis not present

## 2023-02-11 ENCOUNTER — Other Ambulatory Visit: Payer: Self-pay | Admitting: Family Medicine

## 2023-02-11 DIAGNOSIS — Z0000 Encounter for general adult medical examination without abnormal findings: Secondary | ICD-10-CM

## 2023-03-18 ENCOUNTER — Ambulatory Visit: Payer: Medicare Other

## 2024-04-28 ENCOUNTER — Other Ambulatory Visit (HOSPITAL_COMMUNITY): Payer: Self-pay | Admitting: Family Medicine

## 2024-04-28 DIAGNOSIS — R937 Abnormal findings on diagnostic imaging of other parts of musculoskeletal system: Secondary | ICD-10-CM

## 2024-04-29 ENCOUNTER — Ambulatory Visit (HOSPITAL_COMMUNITY)
Admission: RE | Admit: 2024-04-29 | Discharge: 2024-04-29 | Disposition: A | Source: Ambulatory Visit | Attending: Family Medicine | Admitting: Family Medicine

## 2024-04-29 DIAGNOSIS — R937 Abnormal findings on diagnostic imaging of other parts of musculoskeletal system: Secondary | ICD-10-CM
